# Patient Record
Sex: Female | Born: 1985 | Race: White | Hispanic: No | Marital: Single | Smoking: Current every day smoker
Health system: Southern US, Community
[De-identification: ages and names within clinical notes are randomized; demographics above are authoritative.]

## PROBLEM LIST (undated history)

## (undated) HISTORY — PX: HERNIA REPAIR: SHX51

---
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
## 2005-01-10 ENCOUNTER — Other Ambulatory Visit: Admission: RE | Admit: 2005-01-10 | Discharge: 2005-01-10 | Payer: Self-pay | Admitting: Obstetrics and Gynecology

## 2005-05-15 ENCOUNTER — Inpatient Hospital Stay (HOSPITAL_COMMUNITY): Admission: AD | Admit: 2005-05-15 | Discharge: 2005-05-15 | Payer: Self-pay | Admitting: Obstetrics and Gynecology

## 2005-05-20 ENCOUNTER — Ambulatory Visit (HOSPITAL_COMMUNITY): Admission: RE | Admit: 2005-05-20 | Discharge: 2005-05-20 | Payer: Self-pay | Admitting: Obstetrics and Gynecology

## 2005-05-27 ENCOUNTER — Ambulatory Visit (HOSPITAL_COMMUNITY): Admission: RE | Admit: 2005-05-27 | Discharge: 2005-05-27 | Payer: Self-pay | Admitting: Obstetrics and Gynecology

## 2005-06-03 ENCOUNTER — Ambulatory Visit (HOSPITAL_COMMUNITY): Admission: RE | Admit: 2005-06-03 | Discharge: 2005-06-03 | Payer: Self-pay | Admitting: Obstetrics and Gynecology

## 2005-06-20 ENCOUNTER — Inpatient Hospital Stay (HOSPITAL_COMMUNITY): Admission: AD | Admit: 2005-06-20 | Discharge: 2005-06-20 | Payer: Self-pay | Admitting: Psychiatry

## 2005-06-20 ENCOUNTER — Inpatient Hospital Stay (HOSPITAL_COMMUNITY): Admission: AD | Admit: 2005-06-20 | Discharge: 2005-06-20 | Payer: Self-pay | Admitting: Obstetrics and Gynecology

## 2005-06-21 ENCOUNTER — Inpatient Hospital Stay (HOSPITAL_COMMUNITY): Admission: AD | Admit: 2005-06-21 | Discharge: 2005-06-23 | Payer: Self-pay | Admitting: Obstetrics and Gynecology

## 2009-05-18 ENCOUNTER — Emergency Department (HOSPITAL_COMMUNITY): Admission: EM | Admit: 2009-05-18 | Discharge: 2009-05-18 | Payer: Self-pay | Admitting: Family Medicine

## 2009-09-09 ENCOUNTER — Emergency Department (HOSPITAL_COMMUNITY): Admission: EM | Admit: 2009-09-09 | Discharge: 2009-09-09 | Payer: Self-pay | Admitting: Family Medicine

## 2009-10-24 ENCOUNTER — Emergency Department (HOSPITAL_COMMUNITY): Admission: EM | Admit: 2009-10-24 | Discharge: 2009-10-25 | Payer: Self-pay | Admitting: Emergency Medicine

## 2010-05-06 ENCOUNTER — Encounter: Payer: Self-pay | Admitting: Obstetrics and Gynecology

## 2010-07-01 LAB — URINE MICROSCOPIC-ADD ON

## 2010-07-01 LAB — POCT I-STAT, CHEM 8
BUN: 9 mg/dL (ref 6–23)
Calcium, Ion: 1.06 mmol/L — ABNORMAL LOW (ref 1.12–1.32)
Creatinine, Ser: 1.1 mg/dL (ref 0.4–1.2)
Glucose, Bld: 98 mg/dL (ref 70–99)
HCT: 43 % (ref 36.0–46.0)
Hemoglobin: 14.6 g/dL (ref 12.0–15.0)
TCO2: 23 mmol/L (ref 0–100)

## 2010-07-01 LAB — URINALYSIS, ROUTINE W REFLEX MICROSCOPIC
Bilirubin Urine: NEGATIVE
Glucose, UA: NEGATIVE mg/dL
Nitrite: NEGATIVE
Specific Gravity, Urine: 1.023 (ref 1.005–1.030)
Urobilinogen, UA: 0.2 mg/dL (ref 0.0–1.0)

## 2010-07-01 LAB — CBC
Platelets: 181 10*3/uL (ref 150–400)
RDW: 13.9 % (ref 11.5–15.5)

## 2010-07-01 LAB — DIFFERENTIAL
Basophils Absolute: 0 10*3/uL (ref 0.0–0.1)
Lymphocytes Relative: 34 % (ref 12–46)
Lymphs Abs: 2.3 10*3/uL (ref 0.7–4.0)
Neutrophils Relative %: 52 % (ref 43–77)

## 2013-06-18 ENCOUNTER — Other Ambulatory Visit: Payer: Self-pay | Admitting: Obstetrics & Gynecology

## 2013-06-18 ENCOUNTER — Other Ambulatory Visit (HOSPITAL_COMMUNITY)
Admission: RE | Admit: 2013-06-18 | Discharge: 2013-06-18 | Disposition: A | Discharge status: Home / Self Care | Payer: BC Managed Care – PPO | Source: Ambulatory Visit | Attending: Obstetrics & Gynecology | Admitting: Obstetrics & Gynecology

## 2013-06-18 DIAGNOSIS — R87810 Cervical high risk human papillomavirus (HPV) DNA test positive: Secondary | ICD-10-CM | POA: Insufficient documentation

## 2013-06-18 DIAGNOSIS — Z1151 Encounter for screening for human papillomavirus (HPV): Secondary | ICD-10-CM | POA: Insufficient documentation

## 2013-06-18 DIAGNOSIS — Z124 Encounter for screening for malignant neoplasm of cervix: Secondary | ICD-10-CM | POA: Insufficient documentation

## 2013-06-18 DIAGNOSIS — Z113 Encounter for screening for infections with a predominantly sexual mode of transmission: Secondary | ICD-10-CM | POA: Insufficient documentation

## 2013-07-09 ENCOUNTER — Other Ambulatory Visit: Payer: Self-pay | Admitting: Obstetrics & Gynecology

## 2013-08-20 ENCOUNTER — Other Ambulatory Visit: Payer: Self-pay | Admitting: Obstetrics & Gynecology

## 2013-11-19 ENCOUNTER — Ambulatory Visit (INDEPENDENT_AMBULATORY_CARE_PROVIDER_SITE_OTHER): Payer: BC Managed Care – PPO | Admitting: Licensed Clinical Social Worker

## 2013-11-19 DIAGNOSIS — F411 Generalized anxiety disorder: Secondary | ICD-10-CM

## 2013-11-19 DIAGNOSIS — F331 Major depressive disorder, recurrent, moderate: Secondary | ICD-10-CM

## 2013-12-03 ENCOUNTER — Ambulatory Visit: Payer: BC Managed Care – PPO | Admitting: Licensed Clinical Social Worker

## 2016-05-31 ENCOUNTER — Other Ambulatory Visit: Payer: Self-pay | Admitting: Obstetrics and Gynecology

## 2016-06-03 LAB — CYTOLOGY - PAP

## 2016-06-16 DIAGNOSIS — F17200 Nicotine dependence, unspecified, uncomplicated: Secondary | ICD-10-CM | POA: Insufficient documentation

## 2017-08-05 DIAGNOSIS — R109 Unspecified abdominal pain: Secondary | ICD-10-CM | POA: Diagnosis not present

## 2017-08-05 DIAGNOSIS — B9681 Helicobacter pylori [H. pylori] as the cause of diseases classified elsewhere: Secondary | ICD-10-CM | POA: Diagnosis not present

## 2017-08-05 DIAGNOSIS — E119 Type 2 diabetes mellitus without complications: Secondary | ICD-10-CM | POA: Diagnosis not present

## 2017-08-05 DIAGNOSIS — R112 Nausea with vomiting, unspecified: Secondary | ICD-10-CM | POA: Diagnosis not present

## 2017-08-05 DIAGNOSIS — E039 Hypothyroidism, unspecified: Secondary | ICD-10-CM | POA: Diagnosis not present

## 2017-08-05 DIAGNOSIS — E7800 Pure hypercholesterolemia, unspecified: Secondary | ICD-10-CM | POA: Diagnosis not present

## 2017-09-16 DIAGNOSIS — H6123 Impacted cerumen, bilateral: Secondary | ICD-10-CM | POA: Diagnosis not present

## 2017-09-17 DIAGNOSIS — J309 Allergic rhinitis, unspecified: Secondary | ICD-10-CM | POA: Diagnosis not present

## 2017-09-17 DIAGNOSIS — H109 Unspecified conjunctivitis: Secondary | ICD-10-CM | POA: Diagnosis not present

## 2017-09-19 DIAGNOSIS — B301 Conjunctivitis due to adenovirus: Secondary | ICD-10-CM | POA: Diagnosis not present

## 2017-12-02 DIAGNOSIS — B86 Scabies: Secondary | ICD-10-CM | POA: Diagnosis not present

## 2017-12-02 DIAGNOSIS — L309 Dermatitis, unspecified: Secondary | ICD-10-CM | POA: Diagnosis not present

## 2017-12-02 DIAGNOSIS — L299 Pruritus, unspecified: Secondary | ICD-10-CM | POA: Diagnosis not present

## 2018-01-02 DIAGNOSIS — E039 Hypothyroidism, unspecified: Secondary | ICD-10-CM | POA: Diagnosis not present

## 2018-01-02 DIAGNOSIS — E7800 Pure hypercholesterolemia, unspecified: Secondary | ICD-10-CM | POA: Diagnosis not present

## 2018-01-02 DIAGNOSIS — B86 Scabies: Secondary | ICD-10-CM | POA: Diagnosis not present

## 2018-01-02 DIAGNOSIS — E119 Type 2 diabetes mellitus without complications: Secondary | ICD-10-CM | POA: Diagnosis not present

## 2018-01-02 DIAGNOSIS — B354 Tinea corporis: Secondary | ICD-10-CM | POA: Diagnosis not present

## 2018-01-02 DIAGNOSIS — I10 Essential (primary) hypertension: Secondary | ICD-10-CM | POA: Diagnosis not present

## 2018-01-08 DIAGNOSIS — Z23 Encounter for immunization: Secondary | ICD-10-CM | POA: Diagnosis not present

## 2018-05-19 DIAGNOSIS — R509 Fever, unspecified: Secondary | ICD-10-CM | POA: Diagnosis not present

## 2018-05-19 DIAGNOSIS — J111 Influenza due to unidentified influenza virus with other respiratory manifestations: Secondary | ICD-10-CM | POA: Diagnosis not present

## 2018-05-19 DIAGNOSIS — J020 Streptococcal pharyngitis: Secondary | ICD-10-CM | POA: Diagnosis not present

## 2018-06-15 ENCOUNTER — Other Ambulatory Visit: Payer: Self-pay

## 2018-06-15 ENCOUNTER — Observation Stay (HOSPITAL_COMMUNITY)
Admission: EM | Admit: 2018-06-15 | Discharge: 2018-06-17 | Disposition: A | Discharge status: Home / Self Care | Payer: BLUE CROSS/BLUE SHIELD | Attending: Surgery | Admitting: Surgery

## 2018-06-15 DIAGNOSIS — K37 Unspecified appendicitis: Secondary | ICD-10-CM | POA: Diagnosis present

## 2018-06-15 DIAGNOSIS — K5900 Constipation, unspecified: Secondary | ICD-10-CM | POA: Insufficient documentation

## 2018-06-15 DIAGNOSIS — R1031 Right lower quadrant pain: Secondary | ICD-10-CM | POA: Diagnosis not present

## 2018-06-15 DIAGNOSIS — K3589 Other acute appendicitis without perforation or gangrene: Secondary | ICD-10-CM | POA: Diagnosis not present

## 2018-06-15 DIAGNOSIS — K810 Acute cholecystitis: Secondary | ICD-10-CM | POA: Insufficient documentation

## 2018-06-15 DIAGNOSIS — F17210 Nicotine dependence, cigarettes, uncomplicated: Secondary | ICD-10-CM | POA: Insufficient documentation

## 2018-06-15 LAB — COMPREHENSIVE METABOLIC PANEL
ALBUMIN: 4.4 g/dL (ref 3.5–5.0)
ALK PHOS: 37 U/L — AB (ref 38–126)
ALT: 21 U/L (ref 0–44)
AST: 20 U/L (ref 15–41)
Anion gap: 11 (ref 5–15)
BUN: 11 mg/dL (ref 6–20)
CALCIUM: 9.3 mg/dL (ref 8.9–10.3)
CO2: 22 mmol/L (ref 22–32)
CREATININE: 0.75 mg/dL (ref 0.44–1.00)
Chloride: 106 mmol/L (ref 98–111)
GFR calc Af Amer: >60 mL/min (ref >60)
GFR calc non Af Amer: >60 mL/min (ref >60)
GLUCOSE: 115 mg/dL — AB (ref 70–99)
Potassium: 3.7 mmol/L (ref 3.5–5.1)
SODIUM: 139 mmol/L (ref 135–145)
Total Bilirubin: 2.2 mg/dL — ABNORMAL HIGH (ref 0.3–1.2)
Total Protein: 7.1 g/dL (ref 6.5–8.1)

## 2018-06-15 LAB — I-STAT BETA HCG BLOOD, ED (MC, WL, AP ONLY)

## 2018-06-15 LAB — CBC
HCT: 42.8 % (ref 36.0–46.0)
Hemoglobin: 14.1 g/dL (ref 12.0–15.0)
MCH: 28.1 pg (ref 26.0–34.0)
MCHC: 32.9 g/dL (ref 30.0–36.0)
MCV: 85.3 fL (ref 80.0–100.0)
PLATELETS: 150 10*3/uL (ref 150–400)
RBC: 5.02 MIL/uL (ref 3.87–5.11)
RDW: 13.2 % (ref 11.5–15.5)
WBC: 19.7 10*3/uL — ABNORMAL HIGH (ref 4.0–10.5)
nRBC: 0 % (ref 0.0–0.2)

## 2018-06-15 LAB — LIPASE, BLOOD: Lipase: 87 U/L — ABNORMAL HIGH (ref 11–51)

## 2018-06-15 MED ORDER — SODIUM CHLORIDE 0.9% FLUSH: 3.0000 mL | Freq: Once | INTRAVENOUS | Status: DC

## 2018-06-15 MED ORDER — ONDANSETRON 4 MG PO TBDP
4.0000 mg | ORAL_TABLET | Freq: Once | ORAL | Status: AC | PRN
  Administered 2018-06-15: 4 mg via ORAL
  Filled 2018-06-15: qty 1

## 2018-06-16 ENCOUNTER — Encounter (HOSPITAL_COMMUNITY): Payer: Self-pay | Admitting: General Practice

## 2018-06-16 ENCOUNTER — Observation Stay (HOSPITAL_COMMUNITY): Payer: BLUE CROSS/BLUE SHIELD | Admitting: Anesthesiology

## 2018-06-16 ENCOUNTER — Emergency Department (HOSPITAL_COMMUNITY): Payer: BLUE CROSS/BLUE SHIELD

## 2018-06-16 ENCOUNTER — Encounter (HOSPITAL_COMMUNITY)
Admission: EM | Disposition: A | Discharge status: Home / Self Care | Payer: Self-pay | Source: Home / Self Care | Attending: Emergency Medicine

## 2018-06-16 ENCOUNTER — Other Ambulatory Visit: Payer: Self-pay

## 2018-06-16 DIAGNOSIS — K37 Unspecified appendicitis: Secondary | ICD-10-CM | POA: Diagnosis present

## 2018-06-16 DIAGNOSIS — F17210 Nicotine dependence, cigarettes, uncomplicated: Secondary | ICD-10-CM | POA: Diagnosis not present

## 2018-06-16 DIAGNOSIS — R1031 Right lower quadrant pain: Secondary | ICD-10-CM | POA: Diagnosis not present

## 2018-06-16 DIAGNOSIS — K5900 Constipation, unspecified: Secondary | ICD-10-CM | POA: Diagnosis not present

## 2018-06-16 DIAGNOSIS — K3580 Unspecified acute appendicitis: Secondary | ICD-10-CM | POA: Diagnosis not present

## 2018-06-16 DIAGNOSIS — K810 Acute cholecystitis: Secondary | ICD-10-CM | POA: Diagnosis not present

## 2018-06-16 DIAGNOSIS — K3589 Other acute appendicitis without perforation or gangrene: Secondary | ICD-10-CM | POA: Diagnosis not present

## 2018-06-16 HISTORY — PX: LAPAROSCOPIC APPENDECTOMY: SHX408

## 2018-06-16 LAB — URINALYSIS, ROUTINE W REFLEX MICROSCOPIC
BILIRUBIN URINE: NEGATIVE
Glucose, UA: NEGATIVE mg/dL
Hgb urine dipstick: NEGATIVE
KETONES UR: 80 mg/dL — AB
Leukocytes,Ua: NEGATIVE
NITRITE: NEGATIVE
PH: 5 (ref 5.0–8.0)
PROTEIN: NEGATIVE mg/dL
Specific Gravity, Urine: 1.029 (ref 1.005–1.030)

## 2018-06-16 LAB — SURGICAL PCR SCREEN
MRSA, PCR: NEGATIVE
STAPHYLOCOCCUS AUREUS: NEGATIVE

## 2018-06-16 LAB — HIV ANTIBODY (ROUTINE TESTING W REFLEX): HIV Screen 4th Generation wRfx: NONREACTIVE

## 2018-06-16 SURGERY — APPENDECTOMY, LAPAROSCOPIC
Anesthesia: General | Site: Abdomen

## 2018-06-16 MED ORDER — METRONIDAZOLE IN NACL 5-0.79 MG/ML-% IV SOLN: 500.0000 mg | Freq: Three times a day (TID) | INTRAVENOUS | Status: DC

## 2018-06-16 MED ORDER — HYDROMORPHONE HCL 1 MG/ML IJ SOLN: 0.5000 mg | INTRAMUSCULAR | Status: DC | PRN

## 2018-06-16 MED ORDER — SODIUM CHLORIDE 0.9 % IR SOLN
Status: DC | PRN
  Administered 2018-06-16: 1

## 2018-06-16 MED ORDER — SODIUM CHLORIDE 0.9 % IV SOLN: 2.0000 g | INTRAVENOUS | Status: DC

## 2018-06-16 MED ORDER — FENTANYL CITRATE (PF) 250 MCG/5ML IJ SOLN
INTRAMUSCULAR | Status: AC
  Filled 2018-06-16: qty 5

## 2018-06-16 MED ORDER — FENTANYL CITRATE (PF) 100 MCG/2ML IJ SOLN: 25.0000 ug | INTRAMUSCULAR | Status: DC | PRN

## 2018-06-16 MED ORDER — KETOROLAC TROMETHAMINE 30 MG/ML IJ SOLN
INTRAMUSCULAR | Status: DC | PRN
  Administered 2018-06-16: 30 mg via INTRAVENOUS

## 2018-06-16 MED ORDER — ROCURONIUM BROMIDE 50 MG/5ML IV SOSY
PREFILLED_SYRINGE | INTRAVENOUS | Status: DC | PRN
  Administered 2018-06-16: 40 mg via INTRAVENOUS

## 2018-06-16 MED ORDER — SUGAMMADEX SODIUM 200 MG/2ML IV SOLN
INTRAVENOUS | Status: DC | PRN
  Administered 2018-06-16: 200 mg via INTRAVENOUS

## 2018-06-16 MED ORDER — MIDAZOLAM HCL 5 MG/5ML IJ SOLN
INTRAMUSCULAR | Status: DC | PRN
  Administered 2018-06-16: 2 mg via INTRAVENOUS

## 2018-06-16 MED ORDER — TRAMADOL HCL 50 MG PO TABS
50.0000 mg | ORAL_TABLET | Freq: Four times a day (QID) | ORAL | Status: DC | PRN
  Administered 2018-06-16: 50 mg via ORAL
  Filled 2018-06-16: qty 1

## 2018-06-16 MED ORDER — IOHEXOL 300 MG/ML  SOLN: 30.0000 mL | Freq: Once | INTRAMUSCULAR | Status: DC | PRN

## 2018-06-16 MED ORDER — IOHEXOL 300 MG/ML  SOLN
80.0000 mL | Freq: Once | INTRAMUSCULAR | Status: AC | PRN
  Administered 2018-06-16: 80 mL via INTRAVENOUS

## 2018-06-16 MED ORDER — METHOCARBAMOL 500 MG PO TABS
500.0000 mg | ORAL_TABLET | Freq: Four times a day (QID) | ORAL | Status: DC | PRN
  Administered 2018-06-16 – 2018-06-17 (×2): 500 mg via ORAL
  Filled 2018-06-16 (×2): qty 1

## 2018-06-16 MED ORDER — BISACODYL 10 MG RE SUPP: 10.0000 mg | Freq: Every day | RECTAL | Status: DC | PRN

## 2018-06-16 MED ORDER — BUPIVACAINE HCL (PF) 0.25 % IJ SOLN
INTRAMUSCULAR | Status: AC
  Filled 2018-06-16: qty 30

## 2018-06-16 MED ORDER — DEXAMETHASONE SODIUM PHOSPHATE 10 MG/ML IJ SOLN
INTRAMUSCULAR | Status: DC | PRN
  Administered 2018-06-16: 10 mg via INTRAVENOUS

## 2018-06-16 MED ORDER — PROMETHAZINE HCL 25 MG/ML IJ SOLN: 6.2500 mg | INTRAMUSCULAR | Status: DC | PRN

## 2018-06-16 MED ORDER — KETOROLAC TROMETHAMINE 30 MG/ML IJ SOLN
INTRAMUSCULAR | Status: AC
  Filled 2018-06-16: qty 1

## 2018-06-16 MED ORDER — FENTANYL CITRATE (PF) 100 MCG/2ML IJ SOLN
INTRAMUSCULAR | Status: DC | PRN
  Administered 2018-06-16: 50 ug via INTRAVENOUS
  Administered 2018-06-16: 100 ug via INTRAVENOUS

## 2018-06-16 MED ORDER — ONDANSETRON 4 MG PO TBDP: 4.0000 mg | ORAL_TABLET | Freq: Four times a day (QID) | ORAL | Status: DC | PRN

## 2018-06-16 MED ORDER — DIPHENHYDRAMINE HCL 50 MG/ML IJ SOLN: 25.0000 mg | Freq: Four times a day (QID) | INTRAMUSCULAR | Status: DC | PRN

## 2018-06-16 MED ORDER — HYDRALAZINE HCL 20 MG/ML IJ SOLN: 10.0000 mg | INTRAMUSCULAR | Status: DC | PRN

## 2018-06-16 MED ORDER — ACETAMINOPHEN 500 MG PO TABS
1000.0000 mg | ORAL_TABLET | Freq: Four times a day (QID) | ORAL | Status: DC
  Administered 2018-06-16 – 2018-06-17 (×5): 1000 mg via ORAL
  Filled 2018-06-16 (×5): qty 2

## 2018-06-16 MED ORDER — NICOTINE 21 MG/24HR TD PT24
21.0000 mg | MEDICATED_PATCH | Freq: Once | TRANSDERMAL | Status: AC
  Administered 2018-06-16: 21 mg via TRANSDERMAL
  Filled 2018-06-16: qty 1

## 2018-06-16 MED ORDER — DOCUSATE SODIUM 100 MG PO CAPS
100.0000 mg | ORAL_CAPSULE | Freq: Two times a day (BID) | ORAL | Status: DC
  Administered 2018-06-16: 100 mg via ORAL
  Filled 2018-06-16: qty 1

## 2018-06-16 MED ORDER — LACTATED RINGERS IV SOLN
INTRAVENOUS | Status: DC | PRN
  Administered 2018-06-16 (×2): via INTRAVENOUS

## 2018-06-16 MED ORDER — SODIUM CHLORIDE 0.9 % IV BOLUS
1000.0000 mL | Freq: Once | INTRAVENOUS | Status: AC
  Administered 2018-06-16: 1000 mL via INTRAVENOUS

## 2018-06-16 MED ORDER — METRONIDAZOLE IN NACL 5-0.79 MG/ML-% IV SOLN
500.0000 mg | Freq: Once | INTRAVENOUS | Status: AC
  Administered 2018-06-16: 500 mg via INTRAVENOUS
  Filled 2018-06-16: qty 100

## 2018-06-16 MED ORDER — OXYCODONE HCL 5 MG PO TABS: 5.0000 mg | ORAL_TABLET | Freq: Once | ORAL | Status: DC | PRN

## 2018-06-16 MED ORDER — KETOROLAC TROMETHAMINE 15 MG/ML IJ SOLN
15.0000 mg | Freq: Four times a day (QID) | INTRAMUSCULAR | Status: DC | PRN
  Administered 2018-06-16 – 2018-06-17 (×3): 15 mg via INTRAVENOUS
  Filled 2018-06-16 (×3): qty 1

## 2018-06-16 MED ORDER — ACETAMINOPHEN 10 MG/ML IV SOLN: 1000.0000 mg | Freq: Once | INTRAVENOUS | Status: DC | PRN

## 2018-06-16 MED ORDER — ENOXAPARIN SODIUM 40 MG/0.4ML ~~LOC~~ SOLN
40.0000 mg | SUBCUTANEOUS | Status: DC
  Administered 2018-06-17: 40 mg via SUBCUTANEOUS
  Filled 2018-06-16: qty 0.4

## 2018-06-16 MED ORDER — PROPOFOL 10 MG/ML IV BOLUS
INTRAVENOUS | Status: AC
  Filled 2018-06-16: qty 20

## 2018-06-16 MED ORDER — LACTATED RINGERS IV SOLN
INTRAVENOUS | Status: DC
  Administered 2018-06-16: 13:00:00 via INTRAVENOUS

## 2018-06-16 MED ORDER — METOPROLOL TARTRATE 5 MG/5ML IV SOLN: 5.0000 mg | Freq: Four times a day (QID) | INTRAVENOUS | Status: DC | PRN

## 2018-06-16 MED ORDER — SODIUM CHLORIDE 0.9 % IV SOLN
INTRAVENOUS | Status: DC
  Administered 2018-06-16: 06:00:00 via INTRAVENOUS

## 2018-06-16 MED ORDER — MIDAZOLAM HCL 2 MG/2ML IJ SOLN
INTRAMUSCULAR | Status: AC
  Filled 2018-06-16: qty 2

## 2018-06-16 MED ORDER — DIPHENHYDRAMINE HCL 25 MG PO CAPS: 25.0000 mg | ORAL_CAPSULE | Freq: Four times a day (QID) | ORAL | Status: DC | PRN

## 2018-06-16 MED ORDER — MORPHINE SULFATE (PF) 4 MG/ML IV SOLN
4.0000 mg | Freq: Once | INTRAVENOUS | Status: AC
  Administered 2018-06-16: 4 mg via INTRAVENOUS
  Filled 2018-06-16: qty 1

## 2018-06-16 MED ORDER — 0.9 % SODIUM CHLORIDE (POUR BTL) OPTIME
TOPICAL | Status: DC | PRN
  Administered 2018-06-16: 1000 mL

## 2018-06-16 MED ORDER — ONDANSETRON HCL 4 MG/2ML IJ SOLN: 4.0000 mg | Freq: Four times a day (QID) | INTRAMUSCULAR | Status: DC | PRN

## 2018-06-16 MED ORDER — SUGAMMADEX SODIUM 500 MG/5ML IV SOLN
INTRAVENOUS | Status: AC
  Filled 2018-06-16: qty 5

## 2018-06-16 MED ORDER — PROPOFOL 10 MG/ML IV BOLUS
INTRAVENOUS | Status: DC | PRN
  Administered 2018-06-16: 110 mg via INTRAVENOUS
  Administered 2018-06-16: 60 mg via INTRAVENOUS

## 2018-06-16 MED ORDER — CEFTRIAXONE SODIUM 2 G IJ SOLR
2.0000 g | Freq: Once | INTRAMUSCULAR | Status: AC
  Administered 2018-06-16: 2 g via INTRAVENOUS
  Filled 2018-06-16: qty 20

## 2018-06-16 MED ORDER — LIDOCAINE 2% (20 MG/ML) 5 ML SYRINGE
INTRAMUSCULAR | Status: DC | PRN
  Administered 2018-06-16: 60 mg via INTRAVENOUS

## 2018-06-16 MED ORDER — BUPIVACAINE HCL 0.25 % IJ SOLN
INTRAMUSCULAR | Status: DC | PRN
  Administered 2018-06-16: 20 mL

## 2018-06-16 MED ORDER — ONDANSETRON HCL 4 MG/2ML IJ SOLN
INTRAMUSCULAR | Status: DC | PRN
  Administered 2018-06-16: 4 mg via INTRAVENOUS

## 2018-06-16 MED ORDER — OXYCODONE HCL 5 MG/5ML PO SOLN: 5.0000 mg | Freq: Once | ORAL | Status: DC | PRN

## 2018-06-16 SURGICAL SUPPLY — 48 items
APL SKNCLS STERI-STRIP NONHPOA (GAUZE/BANDAGES/DRESSINGS) ×1
APPLIER CLIP ROT 10 11.4 M/L (STAPLE)
APR CLP MED LRG 11.4X10 (STAPLE)
BAG SPEC RTRVL LRG 6X4 10 (ENDOMECHANICALS) ×1
BENZOIN TINCTURE PRP APPL 2/3 (GAUZE/BANDAGES/DRESSINGS) ×3 IMPLANT
BLADE CLIPPER SURG (BLADE) IMPLANT
CANISTER SUCT 3000ML PPV (MISCELLANEOUS) IMPLANT
CHLORAPREP W/TINT 26ML (MISCELLANEOUS) ×3 IMPLANT
CLIP APPLIE ROT 10 11.4 M/L (STAPLE) IMPLANT
CLOSURE WOUND 1/2 X4 (GAUZE/BANDAGES/DRESSINGS) ×1
COVER SURGICAL LIGHT HANDLE (MISCELLANEOUS) ×3 IMPLANT
COVER WAND RF STERILE (DRAPES) ×3 IMPLANT
CUTTER FLEX LINEAR 45M (STAPLE) ×3 IMPLANT
DRSG TEGADERM 2-3/8X2-3/4 SM (GAUZE/BANDAGES/DRESSINGS) ×6 IMPLANT
DRSG TEGADERM 4X4.75 (GAUZE/BANDAGES/DRESSINGS) ×3 IMPLANT
ELECT REM PT RETURN 9FT ADLT (ELECTROSURGICAL) ×3
ELECTRODE REM PT RTRN 9FT ADLT (ELECTROSURGICAL) ×1 IMPLANT
ENDOLOOP SUT PDS II  0 18 (SUTURE)
ENDOLOOP SUT PDS II 0 18 (SUTURE) IMPLANT
FILTER SMOKE EVAC LAPAROSHD (FILTER) IMPLANT
GAUZE SPONGE 2X2 8PLY STRL LF (GAUZE/BANDAGES/DRESSINGS) ×1 IMPLANT
GLOVE BIO SURGEON STRL SZ7 (GLOVE) ×3 IMPLANT
GLOVE BIOGEL PI IND STRL 7.5 (GLOVE) ×1 IMPLANT
GLOVE BIOGEL PI INDICATOR 7.5 (GLOVE) ×2
GOWN STRL REUS W/ TWL LRG LVL3 (GOWN DISPOSABLE) ×3 IMPLANT
GOWN STRL REUS W/TWL LRG LVL3 (GOWN DISPOSABLE) ×9
KIT BASIN OR (CUSTOM PROCEDURE TRAY) ×3 IMPLANT
KIT TURNOVER KIT B (KITS) ×3 IMPLANT
NS IRRIG 1000ML POUR BTL (IV SOLUTION) ×3 IMPLANT
PAD ARMBOARD 7.5X6 YLW CONV (MISCELLANEOUS) ×6 IMPLANT
POUCH SPECIMEN RETRIEVAL 10MM (ENDOMECHANICALS) ×3 IMPLANT
RELOAD STAPLE 45 3.5 BLU ETS (ENDOMECHANICALS) ×1 IMPLANT
RELOAD STAPLE TA45 3.5 REG BLU (ENDOMECHANICALS) ×3 IMPLANT
SCISSORS ENDO CVD 5DCS (MISCELLANEOUS) IMPLANT
SET IRRIG TUBING LAPAROSCOPIC (IRRIGATION / IRRIGATOR) IMPLANT
SET TUBE SMOKE EVAC HIGH FLOW (TUBING) ×3 IMPLANT
SHEARS HARMONIC ACE PLUS 36CM (ENDOMECHANICALS) ×3 IMPLANT
SLEEVE ENDOPATH XCEL 5M (ENDOMECHANICALS) ×3 IMPLANT
SPECIMEN JAR SMALL (MISCELLANEOUS) ×3 IMPLANT
SPONGE GAUZE 2X2 STER 10/PKG (GAUZE/BANDAGES/DRESSINGS) ×2
STRIP CLOSURE SKIN 1/2X4 (GAUZE/BANDAGES/DRESSINGS) ×2 IMPLANT
SUT MNCRL AB 4-0 PS2 18 (SUTURE) ×3 IMPLANT
TOWEL OR 17X24 6PK STRL BLUE (TOWEL DISPOSABLE) ×3 IMPLANT
TOWEL OR 17X26 10 PK STRL BLUE (TOWEL DISPOSABLE) ×3 IMPLANT
TRAY LAPAROSCOPIC MC (CUSTOM PROCEDURE TRAY) ×3 IMPLANT
TROCAR XCEL BLUNT TIP 100MML (ENDOMECHANICALS) ×3 IMPLANT
TROCAR XCEL NON-BLD 5MMX100MML (ENDOMECHANICALS) ×3 IMPLANT
WATER STERILE IRR 1000ML POUR (IV SOLUTION) ×3 IMPLANT

## 2018-06-16 NOTE — H&P (Signed)
Surgical H&P  CC: abdominal pain  HPI: otherwise healthy 33 year old woman who had acute onset right lower quadrant abdominal pain beginning around 6 PM yesterday. She states that she had a normal breakfast and felt fine throughout the day, she had a dental appointment and had 2 cavities filled around 3 in the afternoon, she did not eat anything after this is the upper part of her jaw was numb and subsequently the pain began. It is extremely severe, sharp pain, which is aggravated by moving and certain positions. The pain has on occasion seemed to radiate into her lower back. This has been associated with nausea and vomiting. She notes a history of mild constipation and does not have regular bowel movements, but denies any recent diarrhea or GI issues. Denies any gross hematuria or pain with urination.  She smokes a pack a day of cigarettes. 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   She reports that she had open bilateral inguinal hernia surgery when she was about 33yo.  No Known Allergies  No past medical history on file.  No family history on file.  Social History   Socioeconomic History  . Marital status: Single    Spouse name: Not on file  . Number of children: Not on file  . Years of education: Not on file  . Highest education level: Not on file  Occupational History  . Not on file  Social Needs  . Financial resource strain: Not on file  . Food insecurity:    Worry: Not on file    Inability: Not on file  . Transportation needs:    Medical: Not on file    Non-medical: Not on file  Tobacco Use  . Smoking status: Not on file  Substance and Sexual Activity  . Alcohol use: Not on file  . Drug use: Not on file  . Sexual activity: Not on file  Lifestyle  . Physical activity:    Days per week: Not on file    Minutes per session: Not on file  . Stress: Not on file  Relationships   . Social connections:    Talks on phone: Not on file    Gets together: Not on file    Attends religious service: Not on file    Active member of club or organization: Not on file    Attends meetings of clubs or organizations: Not on file    Relationship status: Not on file  Other Topics Concern  . Not on file  Social History Narrative  . Not on file    No current facility-administered medications on file prior to encounter.    Current Outpatient Medications on File Prior to Encounter  Medication Sig Dispense Refill  . etonogestrel (NEXPLANON) 68 MG IMPL implant 1 each by Subdermal route once.      Review of Systems: a complete, 10pt review of systems was completed with pertinent positives and negatives as documented in the HPI  Physical Exam: Vitals:   06/16/18 0300 06/16/18 0434  BP: (!) 104/55 108/61  Pulse: 80 94  Resp:  18  Temp:    SpO2: 98% 97%   Gen: A&Ox3, no distress  Head: normocephalic, atraumatic Eyes: extraocular motions intact, anicteric.  Neck: supple without mass or thyromegaly Chest: unlabored respirations, symmetrical air entry, clear bilaterally   Cardiovascular: RRR with palpable distal pulses, no pedal edema Abdomen: soft,  nondistended, Exquisitely tender in the right lower quadrant with guarding. Mildly tender in the right upper and left lower quadrant.  positive Rovsing sign, positive psoas sign. No mass or organomegaly.  Extremities: warm, without edema, no deformities  Neuro: grossly intact Psych: appropriate mood and affect, normal insight  Skin: warm and dry   CBC Latest Ref Rng & Units 06/15/2018 10/24/2009 10/24/2009  WBC 4.0 - 10.5 K/uL 19.7(H) - 6.9  Hemoglobin 12.0 - 15.0 g/dL 32.9 92.4 26.8  Hematocrit 36.0 - 46.0 % 42.8 43.0 40.0  Platelets 150 - 400 K/uL 150 - 181    CMP Latest Ref Rng & Units 06/15/2018 10/24/2009  Glucose 70 - 99 mg/dL 341(D) 98  BUN 6 - 20 mg/dL 11 9  Creatinine 6.22 - 1.00 mg/dL 2.97 1.1  Sodium 989 - 145 mmol/L  139 135  Potassium 3.5 - 5.1 mmol/L 3.7 3.4(L)  Chloride 98 - 111 mmol/L 106 101  CO2 22 - 32 mmol/L 22 -  Calcium 8.9 - 10.3 mg/dL 9.3 -  Total Protein 6.5 - 8.1 g/dL 7.1 -  Total Bilirubin 0.3 - 1.2 mg/dL 2.2(H) -  Alkaline Phos 38 - 126 U/L 37(L) -  AST 15 - 41 U/L 20 -  ALT 0 - 44 U/L 21 -    No results found for: INR, PROTIME  Imaging: Ct Abdomen Pelvis W Contrast  Result Date: 06/16/2018 CLINICAL DATA:  Right lower quadrant abdominal pain EXAM: CT ABDOMEN AND PELVIS WITH CONTRAST TECHNIQUE: Multidetector CT imaging of the abdomen and pelvis was performed using the standard protocol following bolus administration of intravenous contrast. CONTRAST:  23mL OMNIPAQUE IOHEXOL 300 MG/ML  SOLN COMPARISON:  None. FINDINGS: Lower chest:  No contributory findings. Hepatobiliary: No focal liver abnormality.No evidence of biliary obstruction or stone. Pancreas: Unremarkable. Spleen: Unremarkable. Adrenals/Urinary Tract: Negative adrenals. No hydronephrosis or stone. Right renal cystic density. Unremarkable bladder. Stomach/Bowel: Possible early acute appendicitis. The appendiceal wall is mildly thickened at 3 mm and the overall dimensions measure up to 1 cm. There is no mesoappendiceal inflammation or collection. Borderline cecal base thickening/edema. No appendicoliths. No other potential bowel inflammation. Vascular/Lymphatic: No acute vascular abnormality. No mass or adenopathy. Reproductive:No pathologic findings. Other: No ascites or pneumoperitoneum. Musculoskeletal: No acute abnormalities. IMPRESSION: Mild appendiceal thickening that is suspicious for acute appendicitis in this setting. No appendicolith or mesoappendix inflammation. Electronically Signed   By: Marnee Spring M.D.   On: 06/16/2018 04:39     A/P: otherwise healthy 33 year old woman with what appears to be very early appendicitis based on CT scan; she has an unusually marked leukocytosis which may reflect dehydration. I recommend  proceeding with laparoscopic appendectomy. We discussed the surgery including risks of bleeding, infection, pain, scarring, injury to intra-abdominal structures, conversion to open surgery or more extensive resection, risk of staple line leak or delayed abscess, failure to resolve symptoms, postoperative ileus, incisional hernia, as well as general risks of DVT/PE, pneumonia, stroke, heart attack, death. We also discussed the option of nonoperative treatment with antibiotics alone which carries significant chance of recurrent appendicitis. Questions were welcomed and answered to the patient's satisfaction.   -plan laparoscopic appendectomy later today with Dr. Corliss Skains -admitted for fluid resuscitation, IV antibiotics, and symptom management    Phylliss Blakes, MD [facility], [place] Pager [phone]

## 2018-06-16 NOTE — Transfer of Care (Signed)
Immediate Anesthesia Transfer of Care Note  Patient: Alexandria Collins  Procedure(s) Performed: APPENDECTOMY LAPAROSCOPIC (N/A Abdomen)  Patient Location: PACU  Anesthesia Type:General  Level of Consciousness: awake, alert  and oriented  Airway & Oxygen Therapy: Patient Spontanous Breathing and Patient connected to nasal cannula oxygen  Post-op Assessment: Report given to RN, Post -op Vital signs reviewed and stable and Patient moving all extremities X 4  Post vital signs: Reviewed and stable  Last Vitals:  Vitals Value Taken Time  BP 115/75 06/16/2018  2:10 PM  Temp    Pulse 92 06/16/2018  2:13 PM  Resp 15 06/16/2018  2:13 PM  SpO2 100 % 06/16/2018  2:13 PM  Vitals shown include unvalidated device data.  Last Pain:  Vitals:   06/16/18 0954  TempSrc:   PainSc: 0-No pain         Complications: No apparent anesthesia complications

## 2018-06-16 NOTE — ED Provider Notes (Signed)
[facility] EMERGENCY DEPARTMENT Provider Note   CSN: [id number] Arrival date & time: 06/15/18  1955    History   Chief Complaint Chief Complaint  Patient presents with  . Abdominal Pain    HPI Alexandria Collins is a 33 y.o. female.     The history is provided by the patient and medical records.  Abdominal Pain     33 y.o. F with no significant PMH presenting to the ED for abdominal pain.  Patient reports pain started around 6pm, localized to RLQ.  States pain is worse when lying flat or when moving, better when still or knees drawn up to the chest.  Reports nausea/vomitng as well, only able to drink maybe a cup of water all day.  No fevers.  No urinary symptoms.  No pelvic pain or vaginal discharge.  Received zofran in triage which helped a lot with nausea.  Prior hernia repair in infancy, no other abdominal surgeries.  No past medical history on file.  There are no active problems to display for this patient.   OB History   No obstetric history on file.      Home Medications    Prior to Admission medications   Not on File    Family History No family history on file.  Social History Social History   Tobacco Use  . Smoking status: Not on file  Substance Use Topics  . Alcohol use: Not on file  . Drug use: Not on file     Allergies   Patient has no known allergies.   Review of Systems Review of Systems  Gastrointestinal: Positive for abdominal pain.  All other systems reviewed and are negative.    Physical Exam Updated Vital Signs BP 128/78 (BP Location: Right Arm)   Pulse 60   Temp 97.9 F (36.6 C) (Oral)   Resp (!) 22   Ht 5' (1.524 m)   Wt 47.6 kg   SpO2 100%   BMI 20.51 kg/m   Physical Exam Vitals signs and nursing note reviewed.  Constitutional:      Appearance: She is well-developed.  HENT:     Head: Normocephalic and atraumatic.  Eyes:     Conjunctiva/sclera: Conjunctivae normal.     Pupils: Pupils are  equal, round, and reactive to light.  Neck:     Musculoskeletal: Normal range of motion.  Cardiovascular:     Rate and Rhythm: Normal rate and regular rhythm.     Heart sounds: Normal heart sounds.  Pulmonary:     Effort: Pulmonary effort is normal.     Breath sounds: Normal breath sounds.  Abdominal:     General: Bowel sounds are normal.     Palpations: Abdomen is soft.     Tenderness: There is abdominal tenderness in the right lower quadrant.  Musculoskeletal: Normal range of motion.  Skin:    General: Skin is warm and dry.  Neurological:     Mental Status: She is alert and oriented to person, place, and time.      ED Treatments / Results  Labs (all labs ordered are listed, but only abnormal results are displayed) Labs Reviewed  LIPASE, BLOOD - Abnormal; Notable for the following components:      Result Value   Lipase 87 (*)    All other components within normal limits  COMPREHENSIVE METABOLIC PANEL - Abnormal; Notable for the following components:   Glucose, Bld 115 (*)    Alkaline Phosphatase 37 (*)  Total Bilirubin 2.2 (*)    All other components within normal limits  CBC - Abnormal; Notable for the following components:   WBC 19.7 (*)    All other components within normal limits  URINALYSIS, ROUTINE W REFLEX MICROSCOPIC  I-STAT BETA HCG BLOOD, ED ([facility], [facility], [facility] ONLY)    EKG None  Radiology Ct Abdomen Pelvis W Contrast  Result Date: 06/16/2018 CLINICAL DATA:  Right lower quadrant abdominal pain EXAM: CT ABDOMEN AND PELVIS WITH CONTRAST TECHNIQUE: Multidetector CT imaging of the abdomen and pelvis was performed using the standard protocol following bolus administration of intravenous contrast. CONTRAST:  72mL OMNIPAQUE IOHEXOL 300 MG/ML  SOLN COMPARISON:  None. FINDINGS: Lower chest:  No contributory findings. Hepatobiliary: No focal liver abnormality.No evidence of biliary obstruction or stone. Pancreas: Unremarkable. Spleen: Unremarkable. Adrenals/Urinary Tract:  Negative adrenals. No hydronephrosis or stone. Right renal cystic density. Unremarkable bladder. Stomach/Bowel: Possible early acute appendicitis. The appendiceal wall is mildly thickened at 3 mm and the overall dimensions measure up to 1 cm. There is no mesoappendiceal inflammation or collection. Borderline cecal base thickening/edema. No appendicoliths. No other potential bowel inflammation. Vascular/Lymphatic: No acute vascular abnormality. No mass or adenopathy. Reproductive:No pathologic findings. Other: No ascites or pneumoperitoneum. Musculoskeletal: No acute abnormalities. IMPRESSION: Mild appendiceal thickening that is suspicious for acute appendicitis in this setting. No appendicolith or mesoappendix inflammation. Electronically Signed   By: Marnee Spring M.D.   On: 06/16/2018 04:39    Procedures Procedures (including critical care time)  Medications Ordered in ED Medications  sodium chloride flush (NS) 0.9 % injection 3 mL (3 mLs Intravenous Not Given 06/16/18 0158)  iohexol (OMNIPAQUE) 300 MG/ML solution 30 mL (has no administration in time range)  cefTRIAXone (ROCEPHIN) 2 g in sodium chloride 0.9 % 100 mL IVPB (has no administration in time range)    And  metroNIDAZOLE (FLAGYL) IVPB 500 mg (has no administration in time range)  nicotine (NICODERM CQ - dosed in mg/24 hours) patch 21 mg (has no administration in time range)  ondansetron (ZOFRAN-ODT) disintegrating tablet 4 mg (4 mg Oral Given 06/15/18 2056)  morphine 4 MG/ML injection 4 mg (4 mg Intravenous Given 06/16/18 0158)  sodium chloride 0.9 % bolus 1,000 mL (0 mLs Intravenous Stopped 06/16/18 0307)  iohexol (OMNIPAQUE) 300 MG/ML solution 80 mL (80 mLs Intravenous Contrast Given 06/16/18 0407)     Initial Impression / Assessment and Plan / ED Course  I have reviewed the triage vital signs and the nursing notes.  Pertinent labs & imaging results that were available during my care of the patient were reviewed by me and considered in  my medical decision making (see chart for details).  33 year old female presenting to the ED with right lower quadrant pain that began around 6 PM last evening.  She also reports nausea and vomiting.  She is afebrile and nontoxic in appearance here.  Does have tenderness in the right lower quadrant, voluntary guarding.  Labs obtained from triage with leukocytosis of 19,000.  Clinical concern for appendicitis.  Will obtain CT scan.  CT with findings of acute appendicitis.  No appendicolith or abscess noted.  Patient's pain is controlled.  She was given Rocephin and Flagyl.  Discussed with general surgery, Dr. Marshall Cork evaluate patient in the ED and admit for ongoing management.  Final Clinical Impressions(s) / ED Diagnoses   Final diagnoses:  Other acute appendicitis    ED Discharge Orders    None       Garlon Hatchet, PA-C 06/16/18  0601    Ward, Layla Maw, DO 06/16/18 [phone]

## 2018-06-16 NOTE — Progress Notes (Signed)
Arrived to 6n29 from ED at this time. Mother at bedside.

## 2018-06-16 NOTE — Anesthesia Preprocedure Evaluation (Addendum)
Anesthesia Evaluation  Patient identified by MRN, date of birth, ID band Patient awake    Reviewed: Allergy & Precautions, NPO status , Patient's Chart, lab work & pertinent test results  History of Anesthesia Complications Negative for: history of anesthetic complications  Airway Mallampati: I  TM Distance: >3 FB Neck ROM: Full    Dental  (+) Teeth Intact, Dental Advisory Given   Pulmonary Current Smoker,    Pulmonary exam normal breath sounds clear to auscultation       Cardiovascular negative cardio ROS Normal cardiovascular exam Rhythm:Regular Rate:Normal     Neuro/Psych negative neurological ROS     GI/Hepatic Neg liver ROS, Acute appendicitis   Endo/Other  negative endocrine ROS  Renal/GU negative Renal ROS     Musculoskeletal negative musculoskeletal ROS (+)   Abdominal   Peds  Hematology negative hematology ROS (+)   Anesthesia Other Findings Day of surgery medications reviewed with the patient.  Reproductive/Obstetrics                            Anesthesia Physical Anesthesia Plan  ASA: II  Anesthesia Plan: General   Post-op Pain Management:    Induction: Intravenous, Rapid sequence and Cricoid pressure planned  PONV Risk Score and Plan: 4 or greater and Treatment may vary due to age or medical condition, Ondansetron, Dexamethasone, Midazolam and Scopolamine patch - Pre-op  Airway Management Planned: Oral ETT  Additional Equipment:   Intra-op Plan:   Post-operative Plan: Extubation in OR  Informed Consent: I have reviewed the patients History and Physical, chart, labs and discussed the procedure including the risks, benefits and alternatives for the proposed anesthesia with the patient or authorized representative who has indicated his/her understanding and acceptance.     Dental advisory given  Plan Discussed with: CRNA  Anesthesia Plan Comments:         Anesthesia Quick Evaluation

## 2018-06-16 NOTE — ED Notes (Signed)
Attempted report and was asked to call back in 10 minutes.

## 2018-06-16 NOTE — Discharge Summary (Signed)
Physician Discharge Summary  Patient ID: Alexandria Collins MRN: [id number] DOB/AGE: 1985-12-04 33 y.o.  Admit date: 06/15/2018 Discharge date: 06/16/2018  Admission Diagnoses:  Acute cholecystitis Tobacco use  Discharge Diagnoses:  Same as above  Active Problems:   Appendicitis   HPI:  Pt is a healthy 33 year old woman who had acute onset right lower quadrant abdominal pain beginning around 6 PM yesterday. She states that she had a normal breakfast and felt fine throughout the day, she had a dental appointment and had 2 cavities filled around 3 in the afternoon, she did not eat anything after this is the upper part of her jaw was numb and subsequently the pain began. It is extremely severe, sharp pain, which is aggravated by moving and certain positions. The pain has on occasion seemed to radiate into her lower back. This has been associated with nausea and vomiting. She notes a history of mild constipation and does not have regular bowel movements, but denies any recent diarrhea or GI issues. Denies any gross hematuria or pain with urination.  She smokes a pack a day of cigarettes. 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   Procedures Dr. Corliss Skains (06/16/18) - Laparoscopic Appendectomy  Hospital Course:  Workup showed Appendicitis.  Patient was admitted and underwent procedure listed above.  Tolerated procedure well and was transferred to the floor.  Diet was advanced as tolerated.  On POD#1, the patient was voiding well, tolerating diet, ambulating well, pain well controlled, vital signs stable, incisions c/d/i and felt stable for discharge home.  Patient will follow up as outlined below and knows to call with questions or concerns.     Patient was discharged in good condition.  The [place] Substance controlled database was reviewed prior to prescribing narcotic pain medication to this  patient.  Physical Exam: General:  Alert, NAD, pleasant, cooperative Cardio: RRR, S1 & S2 normal, no murmur, rubs, gallops Resp: Effort normal, lungs CTA bilaterally, no wheezes, rales, rhonchi Abd:  Soft, ND, normal bowel sounds, incisions C/D/I, mild TTP around incisions. No peritonitis Skin: warm and dry  Allergies as of 06/17/2018   No Known Allergies     Medication List    TAKE these medications   NEXPLANON 68 MG Impl implant Generic drug:  etonogestrel 1 each by Subdermal route once.   traMADol 50 MG tablet Commonly known as:  ULTRAM Take 1 tablet (50 mg total) by mouth every 6 (six) hours as needed for moderate pain.      Follow-up Information    [facility] Follow up on 06/30/2018.   Specialty:  General Surgery Why:  Your appointment is at 11:00 AM.  Be at the office 30 minutes early for check in. Bring photo ID and insurance information.   Contact information: [address] [place] [address] [phone]           Signed: Mattie Marlin, [facility] Pager [phone]

## 2018-06-16 NOTE — Anesthesia Postprocedure Evaluation (Signed)
Anesthesia Post Note  Patient: Alexandria Collins  Procedure(s) Performed: APPENDECTOMY LAPAROSCOPIC (N/A Abdomen)     Patient location during evaluation: PACU Anesthesia Type: General Level of consciousness: awake and alert Pain management: pain level controlled Vital Signs Assessment: post-procedure vital signs reviewed and stable Respiratory status: spontaneous breathing, nonlabored ventilation, respiratory function stable and patient connected to nasal cannula oxygen Cardiovascular status: blood pressure returned to baseline and stable Postop Assessment: no apparent nausea or vomiting Anesthetic complications: no    Last Vitals:  Vitals:   06/16/18 1515 06/16/18 1849  BP: 110/62 111/67  Pulse: 91 80  Resp:    Temp: 36.9 C 37.1 C  SpO2: 100% 100%    Last Pain:  Vitals:   06/16/18 1946  TempSrc:   PainSc: 7                  Ryan P Ellender

## 2018-06-16 NOTE — Discharge Instructions (Signed)
[facility] ______[facility] °LAPAROSCOPIC SURGERY: POST OP INSTRUCTIONS °Always review your discharge instruction sheet given to you by the facility where your surgery was performed. °IF YOU HAVE DISABILITY OR FAMILY LEAVE FORMS, YOU MUST BRING THEM TO THE OFFICE FOR PROCESSING.   °DO NOT GIVE THEM TO YOUR DOCTOR. ° °1. A prescription for pain medication may be given to you upon discharge.  Take your pain medication as prescribed, if needed.  If narcotic pain medicine is not needed, then you may take acetaminophen (Tylenol) or ibuprofen (Advil) as needed. °2. Take your usually prescribed medications unless otherwise directed. °3. If you need a refill on your pain medication, please contact your pharmacy.  They will contact our office to request authorization. Prescriptions will not be filled after 5pm or on week-ends. °4. You should follow a light diet the first few days after arrival home, such as soup and crackers, etc.  Be sure to include lots of fluids daily. °5. Most patients will experience some swelling and bruising in the area of the incisions.  Ice packs will help.  Swelling and bruising can take several days to resolve.  °6. It is common to experience some constipation if taking pain medication after surgery.  Increasing fluid intake and taking a stool softener (such as Colace) will usually help or prevent this problem from occurring.  A mild laxative (Milk of Magnesia or Miralax) should be taken according to package instructions if there are no bowel movements after 48 hours. °7. Unless discharge instructions indicate otherwise, you may remove your bandages 24-48 hours after surgery, and you may shower at that time.  You may have steri-strips (small skin tapes) in place directly over the incision.  These strips should be left on the skin for 7-10 days.  If your surgeon used skin glue on the incision, you may shower in 24 hours.  The glue will flake off over the next 2-3 weeks.  Any sutures or  staples will be removed at the office during your follow-up visit. °8. ACTIVITIES:  You may resume regular (light) daily activities beginning the next day--such as daily self-care, walking, climbing stairs--gradually increasing activities as tolerated.  You may have sexual intercourse when it is comfortable.  Refrain from any heavy lifting or straining until approved by your doctor. °a. You may drive when you are no longer taking prescription pain medication, you can comfortably wear a seatbelt, and you can safely maneuver your car and apply brakes. °b. RETURN TO WORK:  __________________________________________________________ °9. You should see your doctor in the office for a follow-up appointment approximately 2-3 weeks after your surgery.  Make sure that you call for this appointment within a day or two after you arrive home to insure a convenient appointment time. °10. OTHER INSTRUCTIONS: __________________________________________________________________________________________________________________________ __________________________________________________________________________________________________________________________ °WHEN TO CALL YOUR DOCTOR: °1. Fever over 101.0 °2. Inability to urinate °3. Continued bleeding from incision. °4. Increased pain, redness, or drainage from the incision. °5. Increasing abdominal pain ° °The clinic staff is available to answer your questions during regular business hours.  Please don’t hesitate to call and ask to speak to one of the nurses for clinical concerns.  If you have a medical emergency, go to the nearest emergency room or call 911.  A surgeon from [facility] is always on call at the hospital. °[address], [address], [place], [place]  [address] ? [address] [address], [place], [place]   [address] °[phone] ? [phone] ? FAX [phone] °Web site:   [email or web address] °

## 2018-06-16 NOTE — Progress Notes (Signed)
Arrived to 6n29 from PACU at time. Family at bedside.

## 2018-06-16 NOTE — Anesthesia Procedure Notes (Signed)
Procedure Name: Intubation Date/Time: 06/16/2018 1:17 PM Performed by: Neldon Newport, CRNA Pre-anesthesia Checklist: Timeout performed, Patient being monitored, Suction available, Emergency Drugs available and Patient identified Patient Re-evaluated:Patient Re-evaluated prior to induction Oxygen Delivery Method: Circle system utilized Preoxygenation: Pre-oxygenation with 100% oxygen Induction Type: IV induction Ventilation: Mask ventilation without difficulty Laryngoscope Size: Mac and 3 Grade View: Grade I Tube type: Oral Tube size: 7.0 mm Number of attempts: 1 Placement Confirmation: breath sounds checked- equal and bilateral,  positive ETCO2 and ETT inserted through vocal cords under direct vision Secured at: 21 cm Tube secured with: Tape Dental Injury: Teeth and Oropharynx as per pre-operative assessment

## 2018-06-16 NOTE — Op Note (Signed)
Appendectomy, Lap, Procedure Note  Indications: The patient presented with a history of right-sided abdominal pain. A CT scan revealed findings consistent with acute appendicitis.  Pre-operative Diagnosis: Acute appendicitis without mention of peritonitis  Post-operative Diagnosis: Same  Surgeon: Wynona Luna   Assistants: none  Anesthesia: General endotracheal anesthesia  ASA Class: 1E  Procedure Details  The patient was seen again in the Holding Room. The risks, benefits, complications, treatment options, and expected outcomes were discussed with the patient and/or family. The possibilities of reaction to medication, perforation of viscus, bleeding, recurrent infection, finding a normal appendix, the need for additional procedures, failure to diagnose a condition, and creating a complication requiring transfusion or operation were discussed. There was concurrence with the proposed plan and informed consent was obtained. The site of surgery was properly noted. The patient was taken to Operating Room, identified as Alexandria Collins and the procedure verified as Appendectomy. A Time Out was held and the above information confirmed.  The patient was placed in the supine position and general anesthesia was induced.  The abdomen was prepped and draped in a sterile fashion. A one centimeter infraumbilical incision was made.  Dissection was carried down to the fascia bluntly.  The fascia was incised vertically.  We entered the peritoneal cavity bluntly.  A pursestring suture was passed around the incision with a 0 Vicryl.  The Hasson cannula was introduced into the abdomen and the tails of the suture were used to hold the Hasson in place.   The pneumoperitoneum was then established maintaining a maximum pressure of 15 mmHg.  Additional 5 mm cannulas then placed in the left lower quadrant of the abdomen and the right upper quadrant under direct visualization. A careful evaluation of the entire  abdomen was carried out. The patient was placed in Trendelenburg and left lateral decubitus position.  The scope was moved to the right upper quadrant port site. The cecum was mobilized medially.  The appendix was identified and was inflamed, but not necrotic or perforated.  The appendix was carefully dissected. The appendix was skeletonized with the harmonic scalpel.   The appendix was divided at its base using an endo-GIA stapler. Minimal appendiceal stump was left in place. There was no evidence of bleeding, leakage, or complication after division of the appendix. Irrigation was also performed and irrigate suctioned from the abdomen as well.  The umbilical port site was closed with the purse string suture. There was no residual palpable fascial defect.  The trocar site skin wounds were closed with 4-0 Monocryl.  Instrument, sponge, and needle counts were correct at the conclusion of the case.   Findings: The appendix was found to be inflamed. There were no signs of necrosis.  There was not perforation. There was not abscess formation.  Estimated Blood Loss:  Minimal         Drains: none         Specimens: appendix         Complications:  None; patient tolerated the procedure well.         Disposition: PACU - hemodynamically stable.         Condition: stable  Wilmon Arms. Corliss Skains, MD, [facility]  General/ Trauma Surgery Beeper [phone]  06/16/2018 1:59 PM

## 2018-06-17 ENCOUNTER — Encounter (HOSPITAL_COMMUNITY): Payer: Self-pay | Admitting: Surgery

## 2018-06-17 LAB — BASIC METABOLIC PANEL
Anion gap: 6 (ref 5–15)
BUN: 9 mg/dL (ref 6–20)
CO2: 20 mmol/L — ABNORMAL LOW (ref 22–32)
Calcium: 8.3 mg/dL — ABNORMAL LOW (ref 8.9–10.3)
Chloride: 113 mmol/L — ABNORMAL HIGH (ref 98–111)
Creatinine, Ser: 0.66 mg/dL (ref 0.44–1.00)
GFR calc Af Amer: >60 mL/min (ref >60)
GFR calc non Af Amer: >60 mL/min (ref >60)
Glucose, Bld: 202 mg/dL — ABNORMAL HIGH (ref 70–99)
POTASSIUM: 4.2 mmol/L (ref 3.5–5.1)
SODIUM: 139 mmol/L (ref 135–145)

## 2018-06-17 MED ORDER — TRAMADOL HCL 50 MG PO TABS: 50.0000 mg | ORAL_TABLET | Freq: Four times a day (QID) | ORAL | 0 refills | Status: AC | PRN

## 2018-06-17 NOTE — Progress Notes (Signed)
Pt ready for home, DC instructions reviewed with pt, follow up appt reviewed with pt and work note given.  No questions verbalized about home self care.

## 2018-06-17 NOTE — Plan of Care (Signed)
  Problem: Bowel/Gastric: Goal: Gastrointestinal status for postoperative course will improve Outcome: Adequate for Discharge   Problem: Education: Goal: Verbalization of understanding the information provided will improve Outcome: Progressing   Problem: Physical Regulation: Goal: Postoperative complications will be avoided or minimized Outcome: Progressing   Problem: Respiratory: Goal: Respiratory status will improve Outcome: Progressing   Problem: Respiratory: Goal: Respiratory status will improve Outcome: Progressing

## 2018-09-17 DIAGNOSIS — L309 Dermatitis, unspecified: Secondary | ICD-10-CM | POA: Diagnosis not present

## 2018-10-20 DIAGNOSIS — L309 Dermatitis, unspecified: Secondary | ICD-10-CM | POA: Diagnosis not present

## 2018-10-20 DIAGNOSIS — L301 Dyshidrosis [pompholyx]: Secondary | ICD-10-CM | POA: Diagnosis not present

## 2018-10-20 DIAGNOSIS — L299 Pruritus, unspecified: Secondary | ICD-10-CM | POA: Diagnosis not present

## 2018-10-30 DIAGNOSIS — B86 Scabies: Secondary | ICD-10-CM | POA: Diagnosis not present

## 2018-10-30 DIAGNOSIS — F458 Other somatoform disorders: Secondary | ICD-10-CM | POA: Diagnosis not present

## 2018-12-31 DIAGNOSIS — Z23 Encounter for immunization: Secondary | ICD-10-CM | POA: Diagnosis not present

## 2019-02-26 DIAGNOSIS — R5383 Other fatigue: Secondary | ICD-10-CM | POA: Diagnosis not present

## 2019-05-14 DIAGNOSIS — F17200 Nicotine dependence, unspecified, uncomplicated: Secondary | ICD-10-CM | POA: Diagnosis not present

## 2019-05-14 DIAGNOSIS — R87810 Cervical high risk human papillomavirus (HPV) DNA test positive: Secondary | ICD-10-CM | POA: Diagnosis not present

## 2019-05-14 DIAGNOSIS — Z302 Encounter for sterilization: Secondary | ICD-10-CM | POA: Diagnosis not present

## 2019-05-14 DIAGNOSIS — Z01419 Encounter for gynecological examination (general) (routine) without abnormal findings: Secondary | ICD-10-CM | POA: Diagnosis not present

## 2019-05-14 DIAGNOSIS — R6882 Decreased libido: Secondary | ICD-10-CM | POA: Diagnosis not present

## 2019-05-14 DIAGNOSIS — Z6821 Body mass index (BMI) 21.0-21.9, adult: Secondary | ICD-10-CM | POA: Diagnosis not present

## 2019-05-14 DIAGNOSIS — Z124 Encounter for screening for malignant neoplasm of cervix: Secondary | ICD-10-CM | POA: Diagnosis not present

## 2019-05-14 DIAGNOSIS — Z113 Encounter for screening for infections with a predominantly sexual mode of transmission: Secondary | ICD-10-CM | POA: Diagnosis not present

## 2019-06-17 DIAGNOSIS — H6121 Impacted cerumen, right ear: Secondary | ICD-10-CM | POA: Diagnosis not present

## 2019-06-17 DIAGNOSIS — H6501 Acute serous otitis media, right ear: Secondary | ICD-10-CM | POA: Diagnosis not present

## 2020-08-15 IMAGING — CT CT ABD-PELV W/ CM
2 of 4 series · 17 of 46 positions shown, 19 images · IV contrast (omnipaque)
Comparison: None.

CLINICAL DATA: Right lower quadrant abdominal pain

EXAM:
CT ABDOMEN AND PELVIS WITH CONTRAST
TECHNIQUE: Multidetector CT imaging of the abdomen and pelvis was performed
using the standard protocol following bolus administration of
intravenous contrast.
CONTRAST:  80mL OMNIPAQUE IOHEXOL 300 MG/ML  SOLN

[Series 3: abdomen 5.0 · axial · 0.69mm/px · z∈[+776,+1136]mm · 14 of 81 slices shown, 16 images]
[im 5/81  soft-tissue]
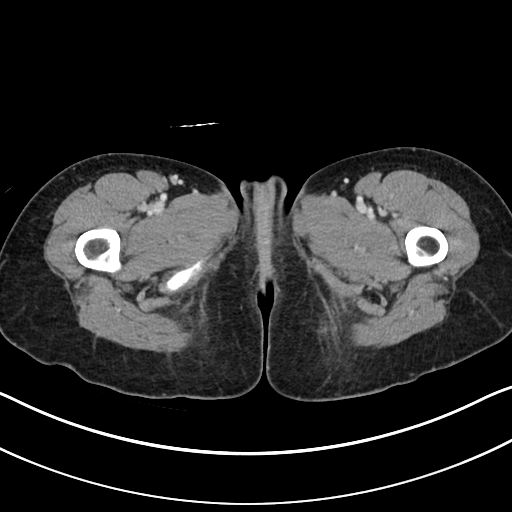
[im 5/81  bone]
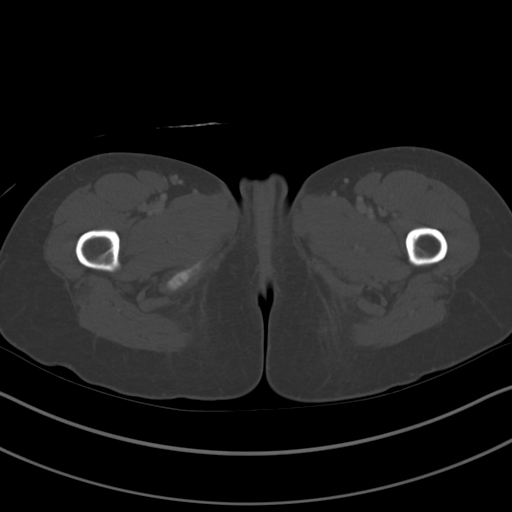
[im 13/81  soft-tissue]
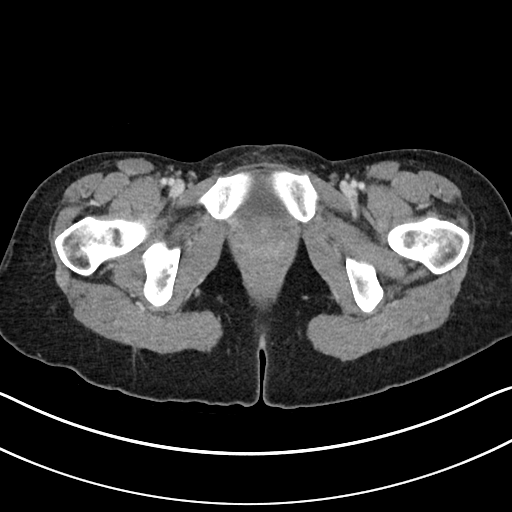
[im 17/81  soft-tissue]
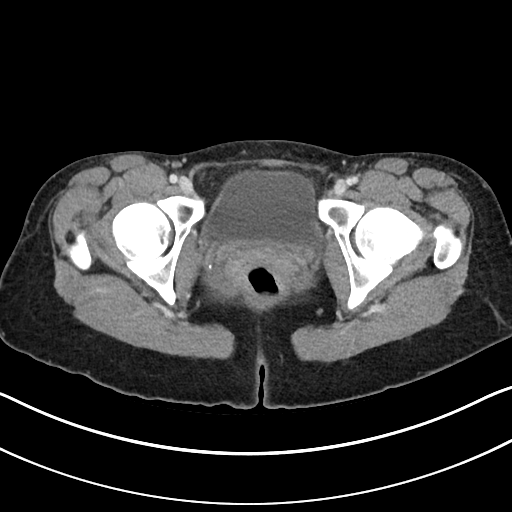
[im 21/81  soft-tissue]
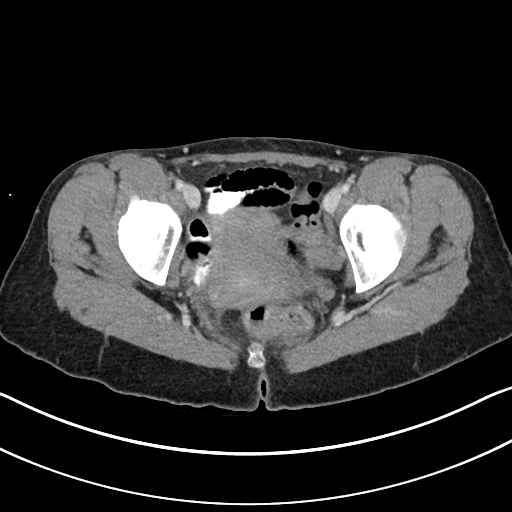
[im 29/81  soft-tissue]
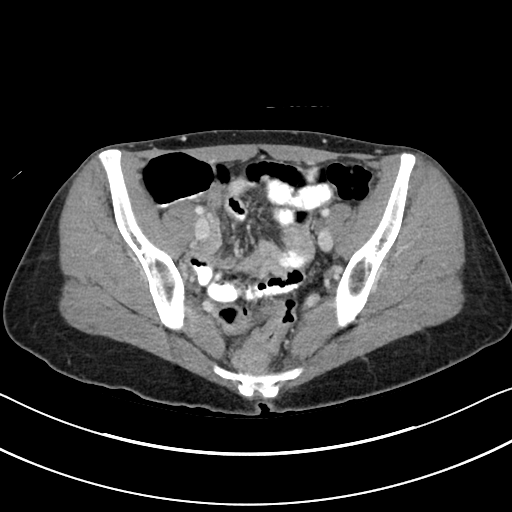
[im 33/81  soft-tissue]
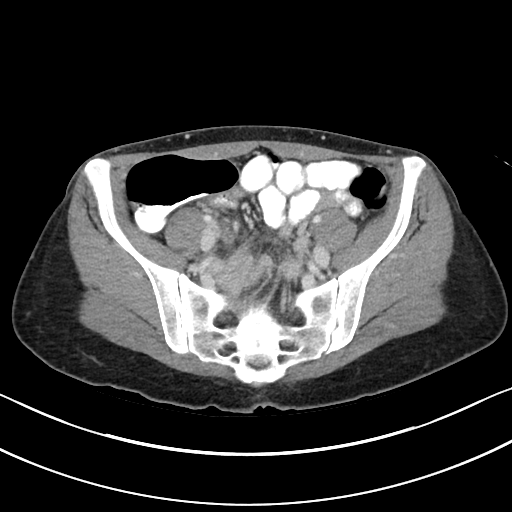
[im 37/81  soft-tissue]
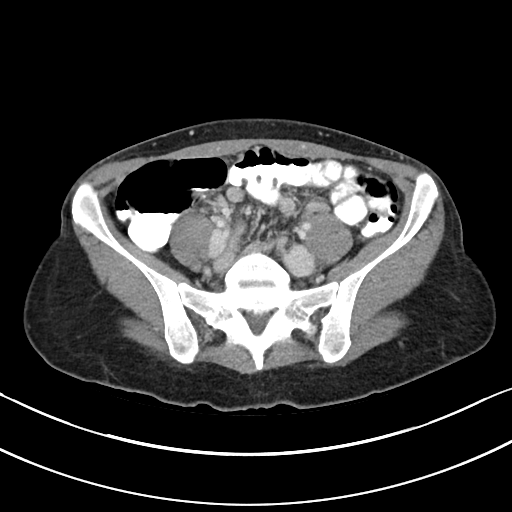
[im 45/81  soft-tissue]
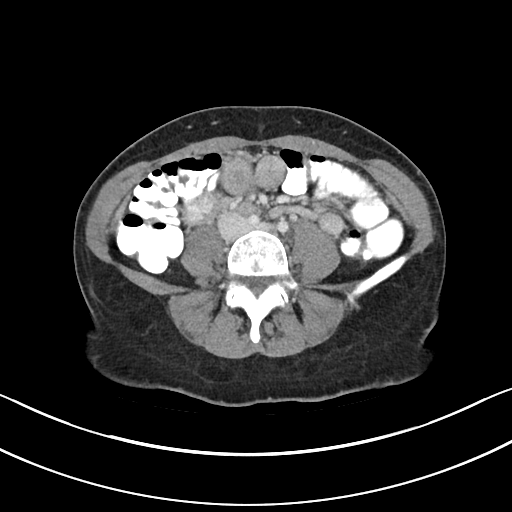
[im 49/81  soft-tissue]
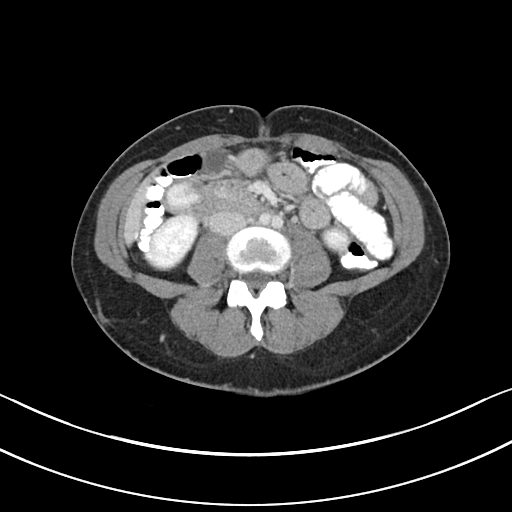
[im 49/81  bone]
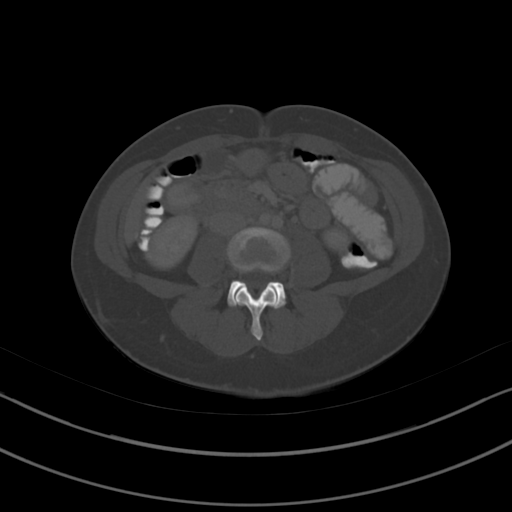
[im 53/81  soft-tissue]
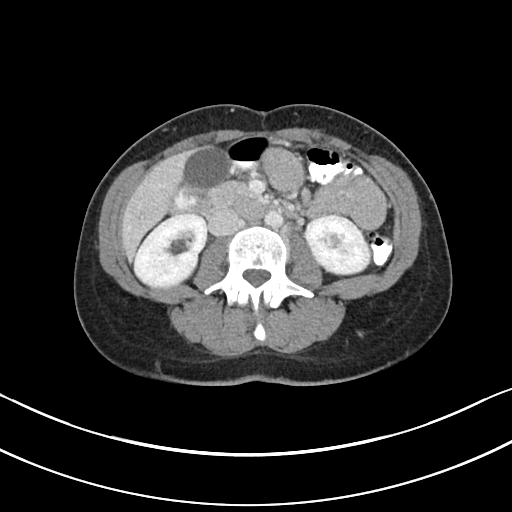
[im 61/81  soft-tissue]
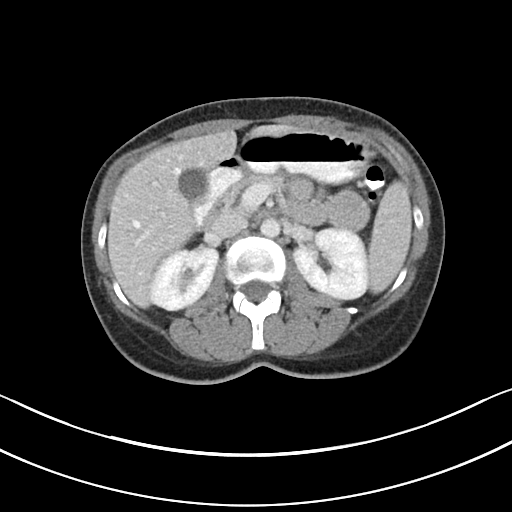
[im 65/81  soft-tissue]
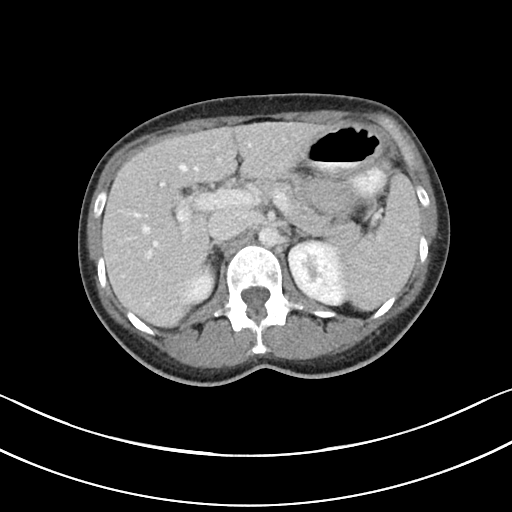
[im 69/81  soft-tissue]
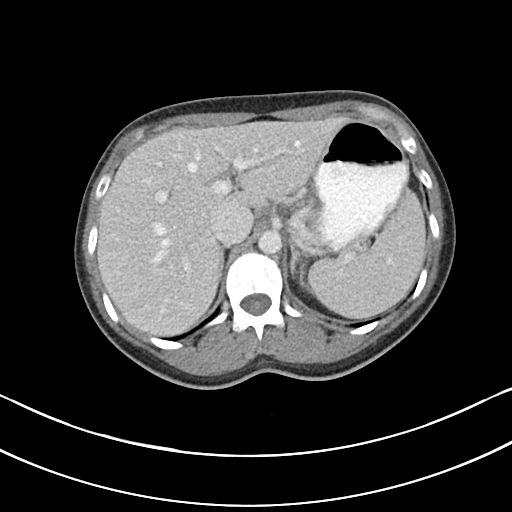
[im 77/81  soft-tissue]
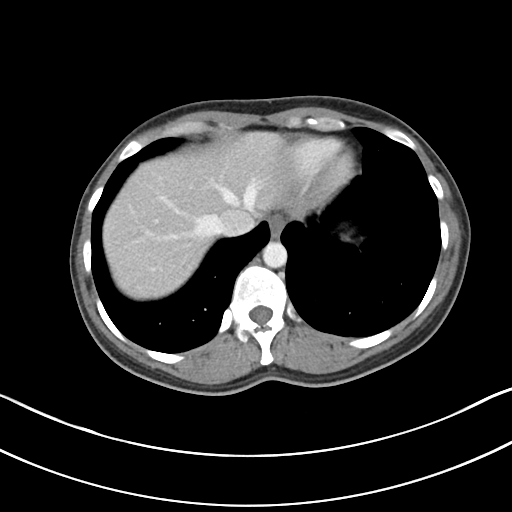

[Series 6: abdomen 3.0 mpr cor · coronal · 0.72mm/px · 3 of 80 slices shown]
[im 27/80  soft-tissue]
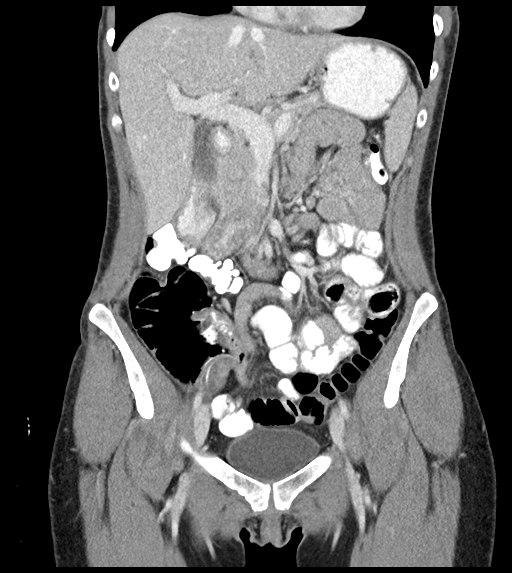
[im 36/80  soft-tissue]
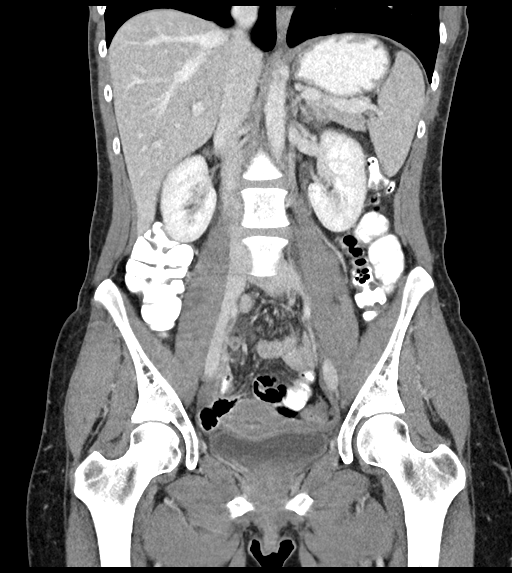
[im 44/80  soft-tissue]
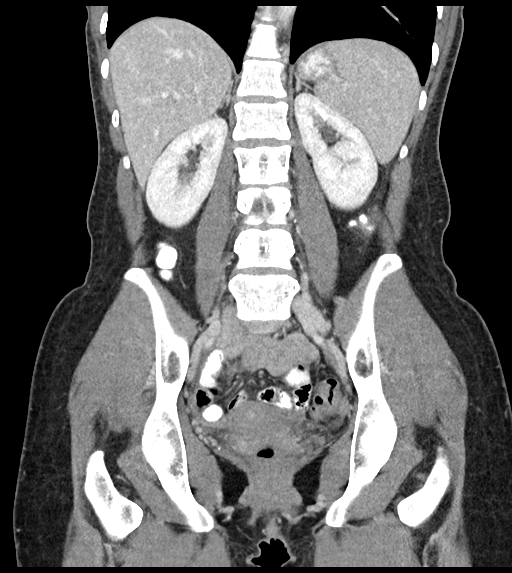

[17 of 46 positions shown; findings below may reference images not displayed]

FINDINGS: Lower chest:  No contributory findings.

Hepatobiliary: No focal liver abnormality.No evidence of biliary
obstruction or stone.

Pancreas: Unremarkable.

Spleen: Unremarkable.

Adrenals/Urinary Tract: Negative adrenals. No hydronephrosis or
stone. Right renal cystic density. Unremarkable bladder.

Stomach/Bowel: Possible early acute appendicitis. The appendiceal
wall is mildly thickened at 3 mm and the overall dimensions measure
up to 1 cm. There is no mesoappendiceal inflammation or collection.
Borderline cecal base thickening/edema. No appendicoliths. No other
potential bowel inflammation.

Vascular/Lymphatic: No acute vascular abnormality. No mass or
adenopathy.

Reproductive:No pathologic findings.

Other: No ascites or pneumoperitoneum.

Musculoskeletal: No acute abnormalities.
IMPRESSION: Mild appendiceal thickening that is suspicious for acute
appendicitis in this setting. No appendicolith or mesoappendix
inflammation.

## 2023-11-21 ENCOUNTER — Ambulatory Visit (INDEPENDENT_AMBULATORY_CARE_PROVIDER_SITE_OTHER): Payer: Self-pay | Admitting: Podiatry

## 2023-11-21 DIAGNOSIS — L74513 Primary focal hyperhidrosis, soles: Secondary | ICD-10-CM

## 2023-11-21 DIAGNOSIS — B351 Tinea unguium: Secondary | ICD-10-CM

## 2023-11-21 NOTE — Progress Notes (Signed)
    Chief Complaint  Patient presents with   Nail Problem    Bilateral hallux nails, slightly discolored but not badly, worried about fungus. She works in [employer]. She is also having issues with smelly, sweaty feet. No itching. Not diabetic and no anti coag.    HPI: 38 y.o. female presents today with concern of possible nail fungus to the bilateral great toenails.  She gets pedicures approximately once every 3 months.  She notes some discoloration and thickening of the toenails.  She also has secondary concern of sweaty feet.  She has not tried any treatment for this.  States that her socks are pretty damp when she gets home from work.  History reviewed. No pertinent past medical history. Past Surgical History:  Procedure Laterality Date   HERNIA REPAIR     at age 25    LAPAROSCOPIC APPENDECTOMY N/A 06/16/2018   Procedure: APPENDECTOMY LAPAROSCOPIC;  Surgeon: Belinda Cough, MD;  Location: [facility] OR;  Service: General;  Laterality: N/A;   No Known Allergies  Physical Exam: Palpable pedal pulses noted.  There is mild white discoloration and thickening of the hallux toenails along the distal 75%.  This is not consistent throughout.  Some increased moisture palpated along the plantar aspect of the feet but no visible beads of sweat noted.  Assessment/Plan of Care: 1. Fungal nail infection   2. Hyperhidrosis of feet     For the hyperhidrosis, recommend Certain Dri wipes, Zeasorb-AF powder, and sweat wicking socks daily.  If this does not provide enough improvement, may recommend prescription Drysol solution.  Clippings of the bilateral hallux toenails were obtained and sent to [facility] labs for fungal nail culture.  Will contact the patient in 2 to 3 weeks with the results and discuss treatment options.   Awanda CHARM Imperial, DPM, FACFAS [facility]     [address] [place], [place] [address]                Office  [phone]  Fax [phone]

## 2023-11-23 ENCOUNTER — Encounter: Payer: Self-pay | Admitting: Podiatry

## 2023-11-30 ENCOUNTER — Ambulatory Visit: Payer: Self-pay | Admitting: Podiatry

## 2024-02-20 ENCOUNTER — Ambulatory Visit: Admitting: Podiatry

## 2024-02-27 ENCOUNTER — Encounter: Admitting: Podiatry

## 2024-02-27 NOTE — Progress Notes (Signed)
Patient did not show for scheduled appointment today.

## 2024-04-07 ENCOUNTER — Ambulatory Visit
Admission: EM | Admit: 2024-04-07 | Discharge: 2024-04-07 | Disposition: A | Discharge status: Home / Self Care | Attending: Family Medicine | Admitting: Family Medicine

## 2024-04-07 DIAGNOSIS — F17200 Nicotine dependence, unspecified, uncomplicated: Secondary | ICD-10-CM | POA: Diagnosis not present

## 2024-04-07 DIAGNOSIS — J329 Chronic sinusitis, unspecified: Secondary | ICD-10-CM | POA: Diagnosis not present

## 2024-04-07 DIAGNOSIS — J40 Bronchitis, not specified as acute or chronic: Secondary | ICD-10-CM | POA: Diagnosis not present

## 2024-04-07 MED ORDER — PROMETHAZINE-DM 6.25-15 MG/5ML PO SYRP: 5.0000 mL | ORAL_SOLUTION | Freq: Three times a day (TID) | ORAL | 0 refills | Status: AC | PRN

## 2024-04-07 MED ORDER — ALBUTEROL SULFATE HFA 108 (90 BASE) MCG/ACT IN AERS: 1.0000 | INHALATION_SPRAY | Freq: Four times a day (QID) | RESPIRATORY_TRACT | 0 refills | Status: AC | PRN

## 2024-04-07 MED ORDER — PREDNISONE 20 MG PO TABS: ORAL_TABLET | ORAL | 0 refills | Status: AC

## 2024-04-07 NOTE — ED Provider Notes (Signed)
 " [facility]  Note:  This document was prepared using Conservation officer, historic buildings and may include unintentional dictation errors.  MRN: [id number] DOB: [birth year]  Subjective:   Alexandria Collins is a 38 y.o. female presenting for 5-day history of persistent productive cough, shortness of breath, chest tightness, sinus congestion and drainage, sinus pain.  No history of asthma.  Patient is a smoker, smokes 1/2-1ppd.   Current Outpatient Medications  Medication Instructions   Buprenorphine HCl-Naloxone HCl 8-2 MG FILM Daily   etonogestrel (NEXPLANON) 68 MG IMPL implant 1 each,  Once   hydrOXYzine (ATARAX) 25 mg, Daily   mupirocin ointment (BACTROBAN) 2 % 1 application , Topical, 2 times daily   traMADol  (ULTRAM ) 50 mg, Oral, Every 6 hours PRN   triamcinolone  ointment (KENALOG) 0.1 % 1 application , Topical, 2 times daily    Allergies[1]  History reviewed. No pertinent past medical history.   Past Surgical History:  Procedure Laterality Date   HERNIA REPAIR     at age 81    LAPAROSCOPIC APPENDECTOMY N/A 06/16/2018   Procedure: APPENDECTOMY LAPAROSCOPIC;  Surgeon: Belinda Cough, MD;  Location: [facility] OR;  Service: General;  Laterality: N/A;    No family history on file.  Social History   Occupational History   Not on file  Tobacco Use   Smoking status: Every Day    Current packs/day: 1.00    Average packs/day: 1 pack/day for 6.0 years (6.0 ttl pk-yrs)    Types: Cigarettes   Smokeless tobacco: Never  Vaping Use   Vaping status: Never Used  Substance and Sexual Activity   Alcohol use: Yes    Comment: social   Drug use: Never   Sexual activity: Not on file     ROS   Objective:   Vitals: BP (P) 122/74 (BP Location: Left Arm)   Pulse (P) 76   Temp (P) 97.8 F (36.6 C) (Oral)   Resp (P) 16   SpO2 (P) 96%   Physical Exam Constitutional:      General: She is not in acute distress.    Appearance: Normal appearance. She is  well-developed and normal weight. She is not ill-appearing, toxic-appearing or diaphoretic.  HENT:     Head: Normocephalic and atraumatic.     Right Ear: Tympanic membrane, ear canal and external ear normal. No drainage or tenderness. No middle ear effusion. There is no impacted cerumen. Tympanic membrane is not erythematous or bulging.     Left Ear: Tympanic membrane, ear canal and external ear normal. No drainage or tenderness.  No middle ear effusion. There is no impacted cerumen. Tympanic membrane is not erythematous or bulging.     Nose: Congestion present. No rhinorrhea.     Mouth/Throat:     Mouth: Mucous membranes are moist. No oral lesions.     Pharynx: No pharyngeal swelling, oropharyngeal exudate, posterior oropharyngeal erythema or uvula swelling.     Tonsils: No tonsillar exudate or tonsillar abscesses.  Eyes:     General: No scleral icterus.       Right eye: No discharge.        Left eye: No discharge.     Extraocular Movements: Extraocular movements intact.     Right eye: Normal extraocular motion.     Left eye: Normal extraocular motion.     Conjunctiva/sclera: Conjunctivae normal.  Cardiovascular:     Rate and Rhythm: Normal rate and regular rhythm.     Heart sounds: Normal heart sounds.  No murmur heard.    No friction rub. No gallop.  Pulmonary:     Effort: Pulmonary effort is normal. No respiratory distress.     Breath sounds: No stridor. Rhonchi (trace, bilaterally throughout) present. No wheezing or rales.  Chest:     Chest wall: No tenderness.  Musculoskeletal:     Cervical back: Normal range of motion and neck supple.  Lymphadenopathy:     Cervical: No cervical adenopathy.  Skin:    General: Skin is warm and dry.  Neurological:     General: No focal deficit present.     Mental Status: She is alert and oriented to person, place, and time.  Psychiatric:        Mood and Affect: Mood normal.        Behavior: Behavior normal.     Assessment and Plan :    PDMP not reviewed this encounter.  1. Sinobronchitis   2. Smoker      Will manage for sinobronchitis with prednisone , albuterol  and general supportive care.  Will defer imaging for now.  Counseled patient on potential for adverse effects with medications prescribed/recommended today, ER and return-to-clinic precautions discussed, patient verbalized understanding.     [1] No Known Allergies    Christopher Savannah, PA-C 04/07/24 1228  "

## 2024-04-07 NOTE — ED Triage Notes (Signed)
 Pt c/o prod cough, SHOB when she takes a deep breath, nasal congestion, sinus pain-sx  x 5 days-last dose ibuprofen 530am-NAD-steady gait

## 2024-04-07 NOTE — Discharge Instructions (Addendum)
 Start prednisone  and albuterol  to help with your sinobronchitis infection. For sore throat or cough try using a honey-based tea. Use 3 teaspoons of honey with juice squeezed from half lemon. Place shaved pieces of ginger into 1/2-1 cup of water and warm over stove top. Then mix the ingredients and repeat every 4 hours as needed. Please take Tylenol  500mg -650mg  once every 6 hours for fevers, aches and pains. Hydrate very well with at least 2 liters (64 ounces) of water. Eat light meals such as soups (chicken and noodles, chicken wild rice, vegetable).  Do not eat any foods that you are allergic to.  Start an antihistamine like Zyrtec (10mg  daily) for postnasal drainage, sinus congestion.  You can take this together with cough syrup as needed.
